# Patient Record
Sex: Female | Born: 1985 | Race: Black or African American | Hispanic: No | Marital: Single | Smoking: Never smoker
Health system: Southern US, Community
[De-identification: ages and names within clinical notes are randomized; demographics above are authoritative.]

---

## 2010-01-07 ENCOUNTER — Emergency Department (HOSPITAL_COMMUNITY): Admission: EM | Admit: 2010-01-07 | Discharge: 2010-01-07 | Payer: Self-pay | Admitting: Family Medicine

## 2010-01-07 ENCOUNTER — Inpatient Hospital Stay (HOSPITAL_COMMUNITY): Admission: AD | Admit: 2010-01-07 | Discharge: 2010-01-07 | Payer: Self-pay | Admitting: Obstetrics & Gynecology

## 2010-06-11 ENCOUNTER — Emergency Department (HOSPITAL_COMMUNITY): Admission: EM | Admit: 2010-06-11 | Discharge: 2010-06-11 | Payer: Self-pay | Admitting: Emergency Medicine

## 2010-07-08 ENCOUNTER — Emergency Department (HOSPITAL_COMMUNITY): Admission: EM | Admit: 2010-07-08 | Discharge: 2010-07-08 | Payer: Self-pay | Admitting: Family Medicine

## 2010-07-11 ENCOUNTER — Emergency Department (HOSPITAL_COMMUNITY): Admission: EM | Admit: 2010-07-11 | Discharge: 2010-07-11 | Payer: Self-pay | Admitting: Family Medicine

## 2010-07-23 ENCOUNTER — Emergency Department (HOSPITAL_COMMUNITY): Admission: EM | Admit: 2010-07-23 | Discharge: 2010-07-23 | Payer: Self-pay | Admitting: Family Medicine

## 2011-01-28 ENCOUNTER — Inpatient Hospital Stay (INDEPENDENT_AMBULATORY_CARE_PROVIDER_SITE_OTHER)
Admission: RE | Admit: 2011-01-28 | Discharge: 2011-01-28 | Disposition: A | Discharge status: Home / Self Care | Payer: Self-pay | Source: Ambulatory Visit | Attending: Family Medicine | Admitting: Family Medicine

## 2011-01-28 DIAGNOSIS — H9209 Otalgia, unspecified ear: Secondary | ICD-10-CM

## 2011-02-06 LAB — POCT URINALYSIS DIPSTICK
Nitrite: NEGATIVE
Nitrite: POSITIVE — AB
Protein, ur: 100 mg/dL — AB
Protein, ur: 100 mg/dL — AB
Specific Gravity, Urine: 1.025 (ref 1.005–1.030)
Specific Gravity, Urine: >=1.03 (ref 1.005–1.030)
Specific Gravity, Urine: >=1.03 (ref 1.005–1.030)
Urobilinogen, UA: 0.2 mg/dL (ref 0.0–1.0)
Urobilinogen, UA: 1 mg/dL (ref 0.0–1.0)
Urobilinogen, UA: 1 mg/dL (ref 0.0–1.0)

## 2011-02-06 LAB — URINE CULTURE
Colony Count: >=100000
Culture  Setup Time: 201109010050

## 2011-02-06 LAB — WET PREP, GENITAL: Trich, Wet Prep: NONE SEEN

## 2011-02-06 LAB — POCT PREGNANCY, URINE
Preg Test, Ur: POSITIVE
Preg Test, Ur: POSITIVE

## 2011-02-06 LAB — HCG, QUANTITATIVE, PREGNANCY
hCG, Beta Chain, Quant, S: 11307 m[IU]/mL — ABNORMAL HIGH (ref <5)
hCG, Beta Chain, Quant, S: 1236 m[IU]/mL — ABNORMAL HIGH (ref <5)
hCG, Beta Chain, Quant, S: 9072 m[IU]/mL — ABNORMAL HIGH (ref <5)

## 2011-02-06 LAB — GC/CHLAMYDIA PROBE AMP, GENITAL
Chlamydia, DNA Probe: NEGATIVE
Chlamydia, DNA Probe: NEGATIVE
GC Probe Amp, Genital: NEGATIVE

## 2011-02-07 LAB — COMPREHENSIVE METABOLIC PANEL
ALT: 11 U/L (ref 0–35)
Albumin: 4 g/dL (ref 3.5–5.2)
Alkaline Phosphatase: 42 U/L (ref 39–117)
BUN: 9 mg/dL (ref 6–23)
Chloride: 108 mEq/L (ref 96–112)
GFR calc Af Amer: >60 mL/min (ref >60)
GFR calc non Af Amer: >60 mL/min (ref >60)
Sodium: 136 mEq/L (ref 135–145)
Total Bilirubin: 1.1 mg/dL (ref 0.3–1.2)

## 2011-02-07 LAB — DIFFERENTIAL
Basophils Absolute: 0 10*3/uL (ref 0.0–0.1)
Basophils Relative: 0 % (ref 0–1)
Eosinophils Absolute: 0.1 10*3/uL (ref 0.0–0.7)
Neutro Abs: 5.1 10*3/uL (ref 1.7–7.7)
Neutrophils Relative %: 67 % (ref 43–77)

## 2011-02-07 LAB — CBC
HCT: 37.4 % (ref 36.0–46.0)
MCH: 34.1 pg — ABNORMAL HIGH (ref 26.0–34.0)
MCV: 97.7 fL (ref 78.0–100.0)
Platelets: 209 10*3/uL (ref 150–400)
RBC: 3.82 MIL/uL — ABNORMAL LOW (ref 3.87–5.11)
WBC: 7.6 10*3/uL (ref 4.0–10.5)

## 2011-02-07 LAB — URINALYSIS, ROUTINE W REFLEX MICROSCOPIC
Bilirubin Urine: NEGATIVE
Ketones, ur: NEGATIVE mg/dL
Nitrite: NEGATIVE
Protein, ur: NEGATIVE mg/dL
Urobilinogen, UA: 0.2 mg/dL (ref 0.0–1.0)

## 2011-02-07 LAB — URINE MICROSCOPIC-ADD ON

## 2011-02-07 LAB — LIPASE, BLOOD: Lipase: 27 U/L (ref 11–59)

## 2011-02-12 LAB — CBC
HCT: 43.2 % (ref 36.0–46.0)
MCV: 95.5 fL (ref 78.0–100.0)
Platelets: 263 10*3/uL (ref 150–400)
RBC: 4.52 MIL/uL (ref 3.87–5.11)
WBC: 11.7 10*3/uL — ABNORMAL HIGH (ref 4.0–10.5)

## 2011-02-12 LAB — WET PREP, GENITAL: Trich, Wet Prep: NONE SEEN

## 2011-02-12 LAB — URINALYSIS, ROUTINE W REFLEX MICROSCOPIC
Hgb urine dipstick: NEGATIVE
Protein, ur: NEGATIVE mg/dL
Specific Gravity, Urine: >1.03 — ABNORMAL HIGH (ref 1.005–1.030)
Urobilinogen, UA: 0.2 mg/dL (ref 0.0–1.0)

## 2011-02-12 LAB — POCT URINALYSIS DIP (DEVICE)
Bilirubin Urine: NEGATIVE
Glucose, UA: NEGATIVE mg/dL
Nitrite: NEGATIVE

## 2011-02-12 LAB — HCG, QUANTITATIVE, PREGNANCY: hCG, Beta Chain, Quant, S: 56258 m[IU]/mL — ABNORMAL HIGH (ref <5)

## 2011-02-12 LAB — POCT PREGNANCY, URINE: Preg Test, Ur: POSITIVE

## 2011-04-23 ENCOUNTER — Inpatient Hospital Stay (INDEPENDENT_AMBULATORY_CARE_PROVIDER_SITE_OTHER)
Admission: RE | Admit: 2011-04-23 | Discharge: 2011-04-23 | Disposition: A | Discharge status: Home / Self Care | Payer: Self-pay | Source: Ambulatory Visit | Attending: Emergency Medicine | Admitting: Emergency Medicine

## 2011-04-23 DIAGNOSIS — B354 Tinea corporis: Secondary | ICD-10-CM

## 2011-08-13 IMAGING — US US OB COMP LESS 14 WK
1 series · 14 of 24 positions shown · non-contrast
Comparison: None

CLINICAL DATA: First trimester pregnancy with left lower quadrant
abdominal pain.

OBSTETRIC <14 WK US AND TRANSVAGINAL OB US
TECHNIQUE: Both transabdominal and transvaginal ultrasound
examinations were performed for complete evaluation of the
gestation as well as the maternal uterus, adnexal regions, and
pelvic cul-de-sac.

[Series 1: us ob comp less 14 wks · 0.18mm/px · 14 of 24 slices shown]
[im 1/24]
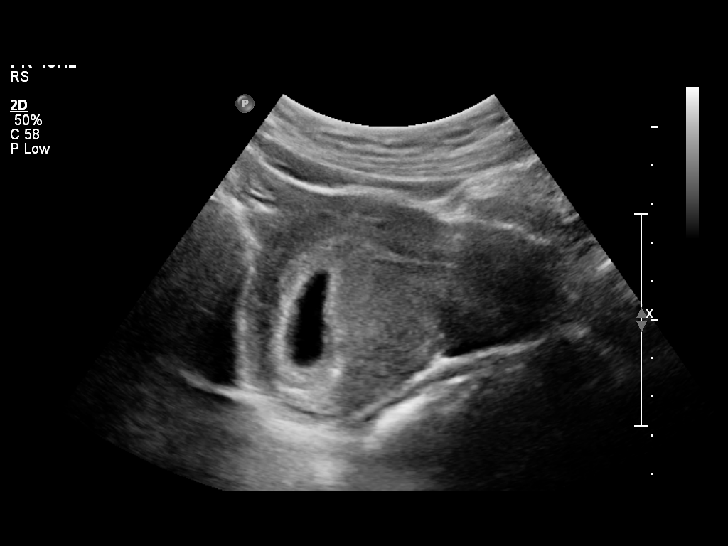
[im 3/24]
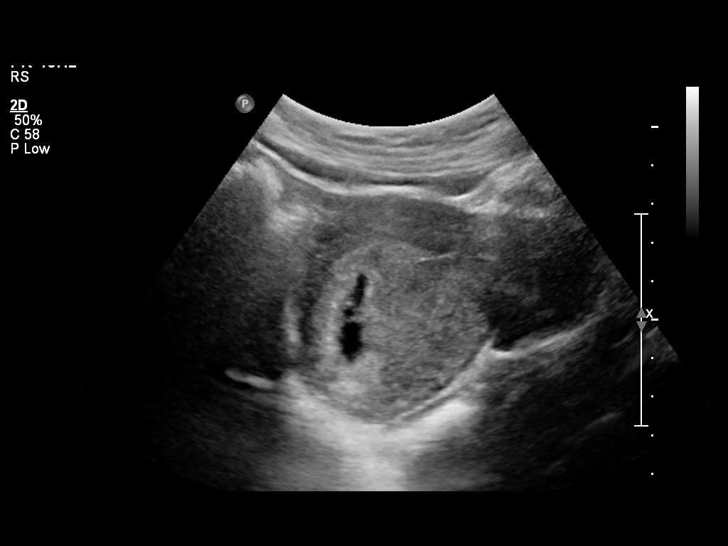
[im 5/24]
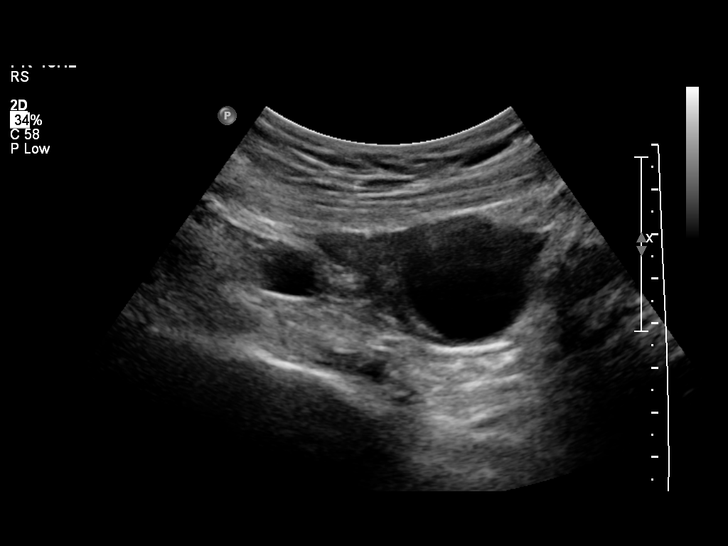
[im 7/24]
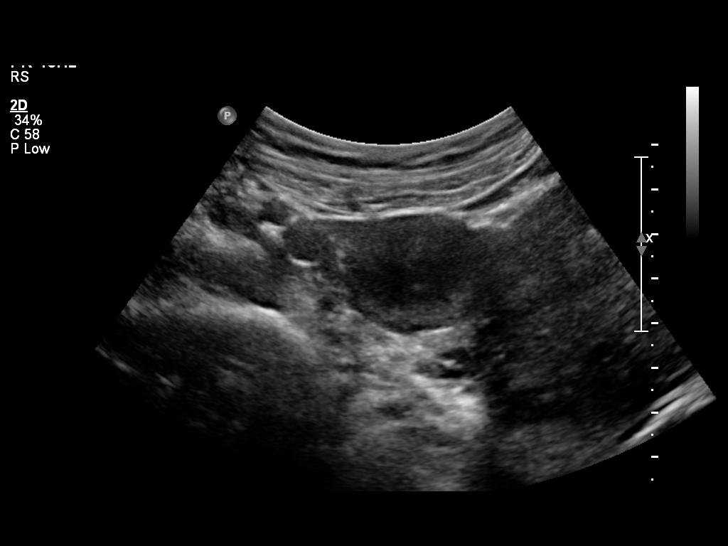
[im 8/24]
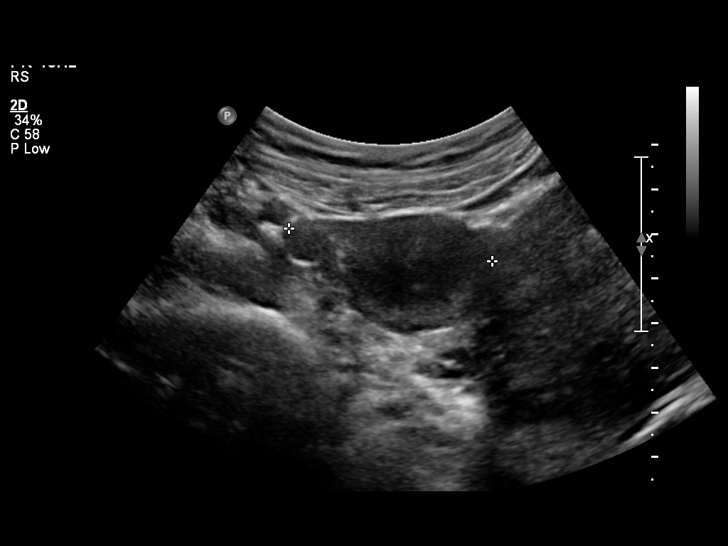
[im 10/24]
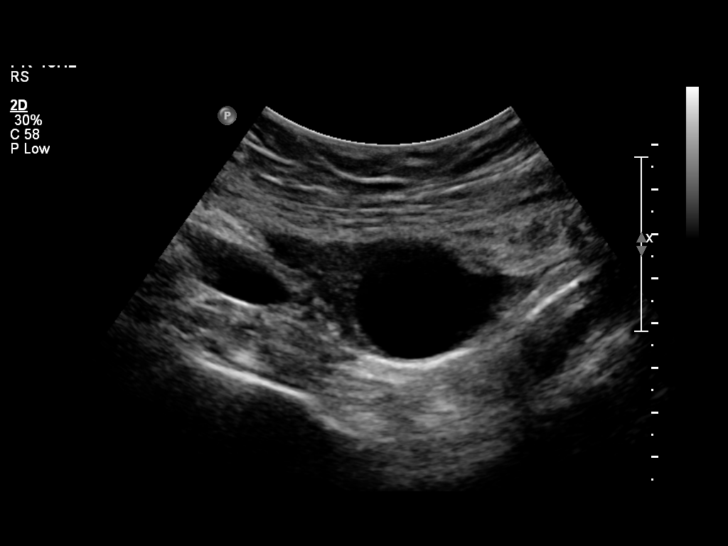
[im 12/24]
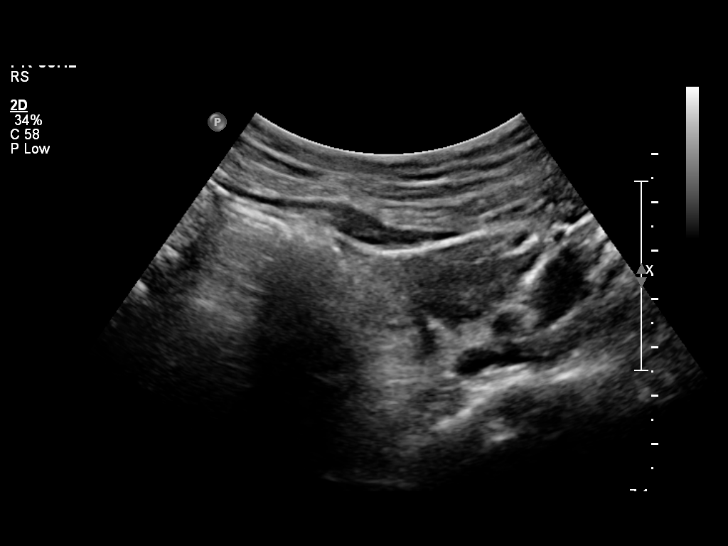
[im 13/24]
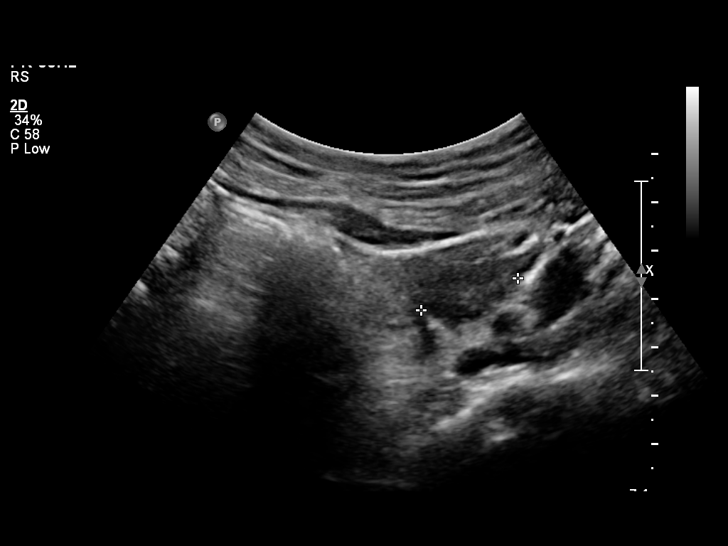
[im 15/24]
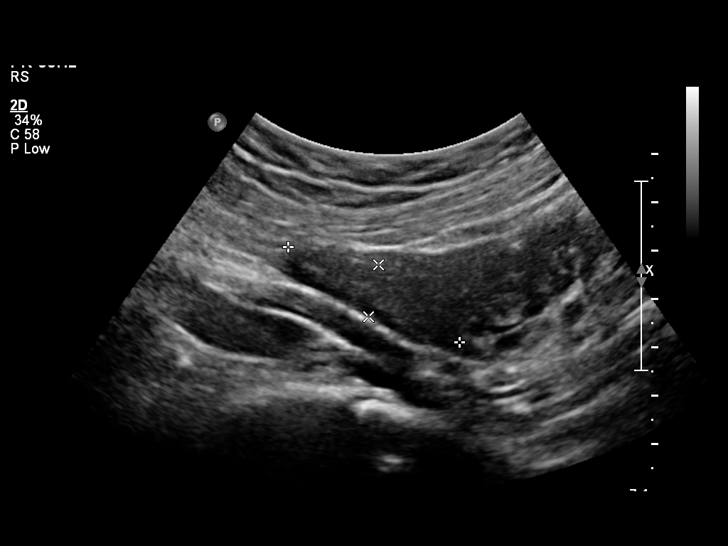
[im 17/24]
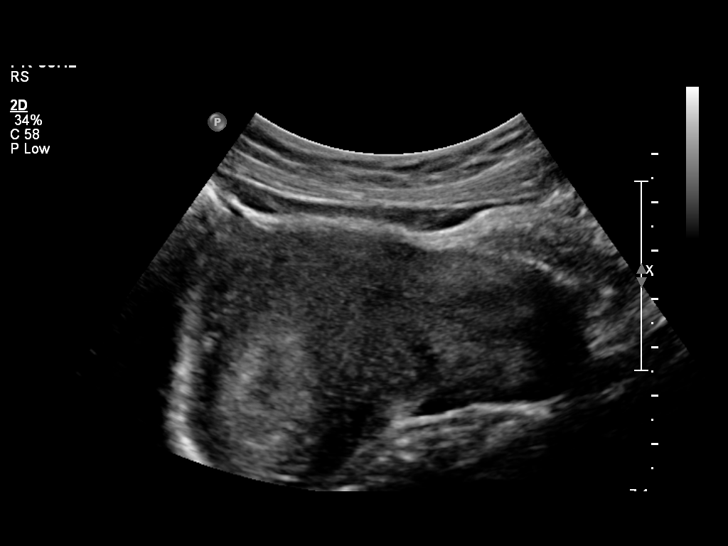
[im 19/24]
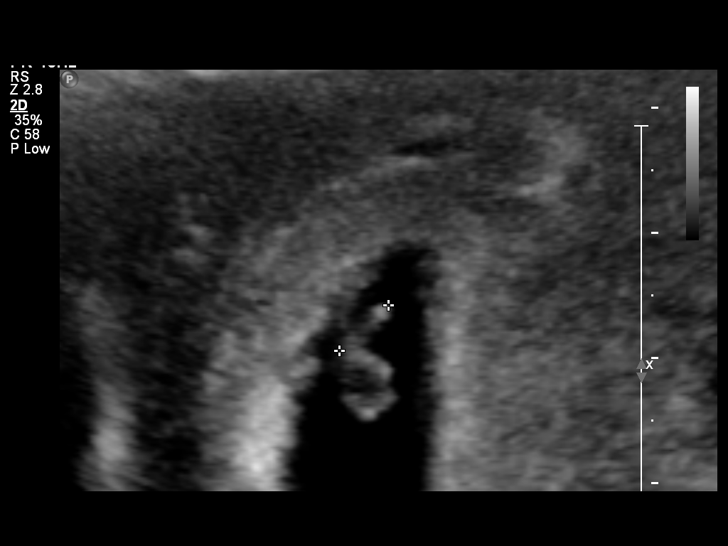
[im 20/24]
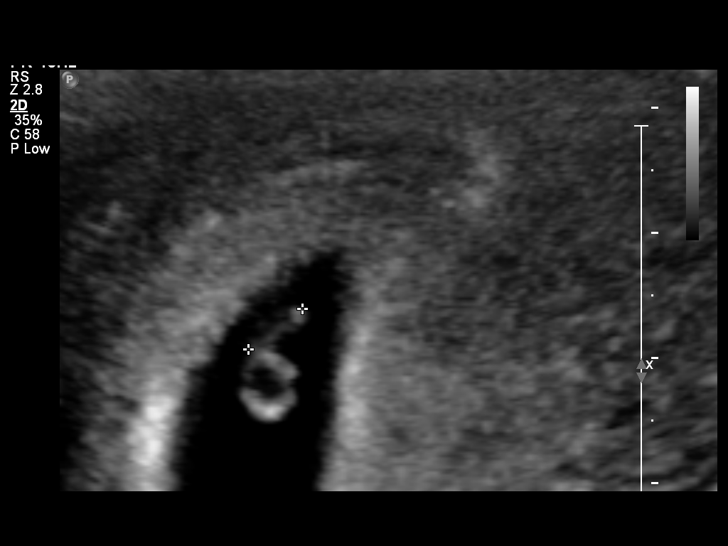
[im 22/24]
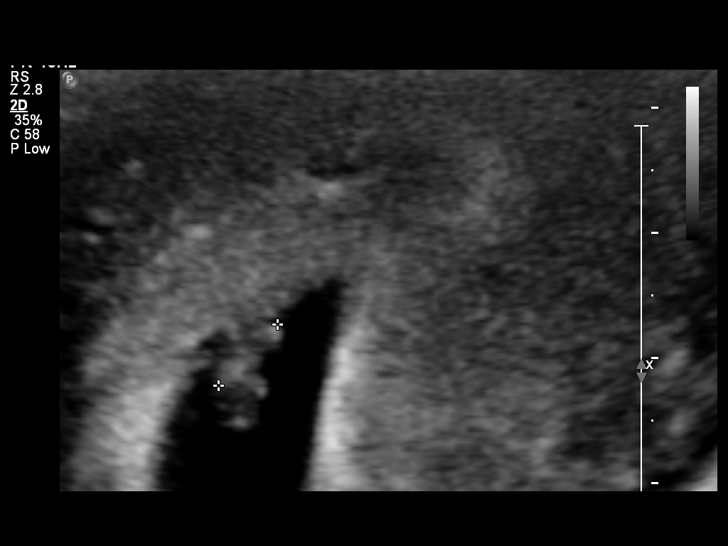
[im 24/24]
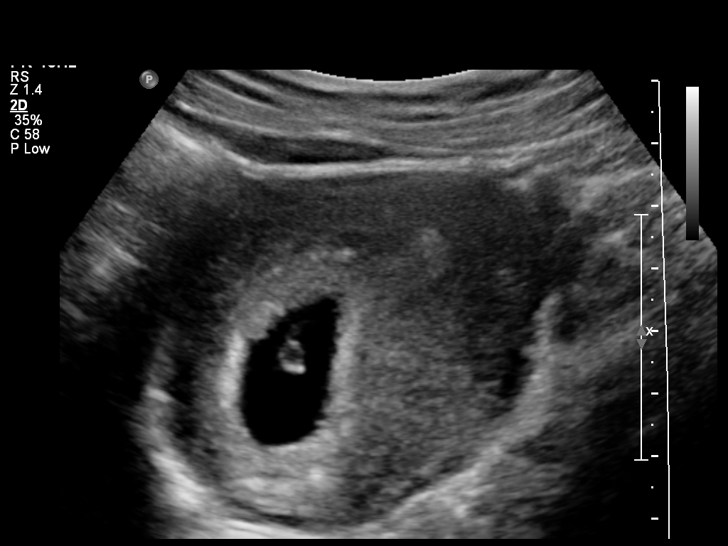

[14 of 24 positions shown; findings below may reference images not displayed]

FINDINGS: There is an intrauterine gestational sac with a fetal
pole demonstrating a crown-rump length of 5.8 mm, corresponding
with a gestational age of 6 weeks 3 days.  There is fetal cardiac
motion at 150 beats per minute.  A yolk sac is identified.  No
significant subchorionic hematoma is seen.

Both maternal ovaries are visualized with a small simple cyst on
the right.  There is no adnexal mass or significant free pelvic
fluid.
IMPRESSION: Single live intrauterine gestation with best estimated gestational
age of 6-week 3 days.  EDC is 08/30/2010.  No demonstrated
abnormality.

## 2012-07-18 ENCOUNTER — Emergency Department (HOSPITAL_COMMUNITY)
Admission: EM | Admit: 2012-07-18 | Discharge: 2012-07-18 | Disposition: A | Discharge status: Home / Self Care | Payer: Self-pay | Attending: Emergency Medicine | Admitting: Emergency Medicine

## 2012-07-18 ENCOUNTER — Encounter (HOSPITAL_COMMUNITY): Payer: Self-pay | Admitting: *Deleted

## 2012-07-18 DIAGNOSIS — N390 Urinary tract infection, site not specified: Secondary | ICD-10-CM | POA: Insufficient documentation

## 2012-07-18 LAB — URINALYSIS, ROUTINE W REFLEX MICROSCOPIC
Glucose, UA: NEGATIVE mg/dL
Ketones, ur: NEGATIVE mg/dL
Nitrite: NEGATIVE
Protein, ur: NEGATIVE mg/dL
Urobilinogen, UA: 0.2 mg/dL (ref 0.0–1.0)

## 2012-07-18 LAB — URINE MICROSCOPIC-ADD ON

## 2012-07-18 MED ORDER — SULFAMETHOXAZOLE-TRIMETHOPRIM 800-160 MG PO TABS: 1.0000 | ORAL_TABLET | Freq: Two times a day (BID) | ORAL | Status: AC

## 2012-07-18 NOTE — ED Provider Notes (Signed)
Attempted to see pt several times over 30 minutes, pt has not been in the room.  No evaluation was performed.  Tobin Chad, MD 07/18/12 [phone]

## 2012-07-18 NOTE — ED Notes (Signed)
Bed:WA03<BR> Expected date:<BR> Expected time:<BR> Means of arrival:<BR> Comments:<BR> Closed 

## 2012-07-18 NOTE — ED Notes (Signed)
Pt sts pain in her lower abdomen related to the urinary frequency.

## 2012-07-18 NOTE — ED Notes (Signed)
Pt sts she has had pain and frequency with urination x 4 days. Sts she is taking ibuprofen for pain.

## 2012-07-21 NOTE — ED Provider Notes (Signed)
History     CSN: [id number]  Arrival date & time 07/18/12  1228   First MD Initiated Contact with Patient 07/18/12 1417      Chief Complaint  Patient presents with  . Urinary Frequency    (Consider location/radiation/quality/duration/timing/severity/associated sxs/prior treatment) HPI Comments: Patient reports dysuria for the past 4 days. She reports associated suprapubic discomfort, increased urinary frequency and malodorous urine. She has taken ibuprofen for the discomfort without relief. She denies hematuria, fever, back pain. She has never had a UTI before.   Patient is a 25 y.o. female presenting with frequency.  Urinary Frequency Pertinent negatives include no chills, diaphoresis, fatigue, fever, headaches, nausea, rash, vomiting or weakness.    History reviewed. No pertinent past medical history.  History reviewed. No pertinent past surgical history.  History reviewed. No pertinent family history.  History  Substance Use Topics  . Smoking status: Never Smoker   . Smokeless tobacco: Not on file  . Alcohol Use: No    OB History    Grav Para Term Preterm Abortions TAB SAB Ect Mult Living                  Review of Systems  Constitutional: Negative for fever, chills, diaphoresis and fatigue.  Gastrointestinal: Negative for nausea, vomiting and diarrhea.  Genitourinary: Positive for dysuria and frequency.  Musculoskeletal: Negative for back pain.  Skin: Negative for rash and wound.  Neurological: Negative for dizziness, weakness and headaches.    Allergies  Review of patient's allergies indicates no known allergies.  Home Medications   Current Outpatient Rx  Name Route Sig Dispense Refill  . IBUPROFEN 200 MG PO TABS Oral Take 400 mg by mouth every 6 (six) hours as needed. Pain    . SULFAMETHOXAZOLE-TRIMETHOPRIM 800-160 MG PO TABS Oral Take 1 tablet by mouth 2 (two) times daily. 14 tablet 0    BP 114/79  Pulse 75  Temp 98.1 F (36.7 C) (Oral)  Resp 16   Ht 5\' 6"  (1.676 m)  Wt 134 lb (60.782 kg)  BMI 21.63 kg/m2  SpO2 100%  LMP 06/27/2012  Physical Exam  Nursing note and vitals reviewed. Constitutional: She is oriented to person, place, and time. She appears well-developed and well-nourished. No distress.  HENT:  Head: Normocephalic and atraumatic.  Eyes: Conjunctivae are normal. No scleral icterus.  Neck: Normal range of motion.  Cardiovascular: Normal rate and regular rhythm.  Exam reveals no gallop and no friction rub.   No murmur heard. Pulmonary/Chest: Effort normal and breath sounds normal. No respiratory distress. She has no wheezes. She has no rales. She exhibits no tenderness.  Abdominal: Soft.       Suprapubic tenderness to palpation.   Musculoskeletal: Normal range of motion.  Neurological: She is alert and oriented to person, place, and time.  Skin: Skin is warm and dry. She is not diaphoretic.  Psychiatric: She has a normal mood and affect. Her behavior is normal.    ED Course  Procedures (including critical care time)  Labs Reviewed  URINALYSIS, ROUTINE W REFLEX MICROSCOPIC - Abnormal; Notable for the following:    APPearance CLOUDY (*)     Hgb urine dipstick MODERATE (*)     Leukocytes, UA LARGE (*)     All other components within normal limits  URINE MICROSCOPIC-ADD ON  LAB REPORT - SCANNED   No results found.   1. UTI (lower urinary tract infection)       MDM  5:13 PM Patient has a  UTI as shown on her urinalysis. I will treat her with Bactrim. She should return with any new or worsening symptoms. No further evaluation needed at this time.         Emilia Beck, PA-C 07/21/12 1717

## 2012-07-22 NOTE — ED Provider Notes (Signed)
Medical screening examination/treatment/procedure(s) were performed by non-physician practitioner and as supervising physician I was immediately available for consultation/collaboration.  Toy Baker, MD 07/22/12 [phone]

## 2012-08-22 ENCOUNTER — Emergency Department (HOSPITAL_COMMUNITY)
Admission: EM | Admit: 2012-08-22 | Discharge: 2012-08-22 | Disposition: A | Discharge status: Home / Self Care | Payer: Self-pay | Attending: Emergency Medicine | Admitting: Emergency Medicine

## 2012-08-22 ENCOUNTER — Encounter (HOSPITAL_COMMUNITY): Payer: Self-pay | Admitting: Emergency Medicine

## 2012-08-22 DIAGNOSIS — N390 Urinary tract infection, site not specified: Secondary | ICD-10-CM | POA: Insufficient documentation

## 2012-08-22 DIAGNOSIS — R110 Nausea: Secondary | ICD-10-CM | POA: Insufficient documentation

## 2012-08-22 LAB — COMPREHENSIVE METABOLIC PANEL
ALT: 10 U/L (ref 0–35)
Alkaline Phosphatase: 53 U/L (ref 39–117)
BUN: 11 mg/dL (ref 6–23)
CO2: 24 mEq/L (ref 19–32)
Calcium: 9.7 mg/dL (ref 8.4–10.5)
GFR calc Af Amer: >90 mL/min (ref >90)
GFR calc non Af Amer: >90 mL/min (ref >90)
Glucose, Bld: 109 mg/dL — ABNORMAL HIGH (ref 70–99)
Potassium: 3.6 mEq/L (ref 3.5–5.1)
Sodium: 137 mEq/L (ref 135–145)

## 2012-08-22 LAB — CBC WITH DIFFERENTIAL/PLATELET
Eosinophils Relative: 0 % (ref 0–5)
HCT: 39.8 % (ref 36.0–46.0)
Hemoglobin: 14.5 g/dL (ref 12.0–15.0)
Lymphocytes Relative: 13 % (ref 12–46)
Lymphs Abs: 2 10*3/uL (ref 0.7–4.0)
MCH: 33.4 pg (ref 26.0–34.0)
MCV: 91.7 fL (ref 78.0–100.0)
Monocytes Relative: 5 % (ref 3–12)
Platelets: 274 10*3/uL (ref 150–400)
RBC: 4.34 MIL/uL (ref 3.87–5.11)
WBC: 15.6 10*3/uL — ABNORMAL HIGH (ref 4.0–10.5)

## 2012-08-22 LAB — URINALYSIS, ROUTINE W REFLEX MICROSCOPIC
Bilirubin Urine: NEGATIVE
Glucose, UA: NEGATIVE mg/dL
Ketones, ur: NEGATIVE mg/dL
Nitrite: NEGATIVE
Specific Gravity, Urine: 1.005 (ref 1.005–1.030)
pH: 6.5 (ref 5.0–8.0)

## 2012-08-22 LAB — URINE MICROSCOPIC-ADD ON

## 2012-08-22 MED ORDER — OXYCODONE-ACETAMINOPHEN 5-325 MG PO TABS: 1.0000 | ORAL_TABLET | Freq: Four times a day (QID) | ORAL | Status: DC | PRN

## 2012-08-22 MED ORDER — SODIUM CHLORIDE 0.9 % IV BOLUS (SEPSIS)
1000.0000 mL | Freq: Once | INTRAVENOUS | Status: AC
  Administered 2012-08-22: 1000 mL via INTRAVENOUS

## 2012-08-22 MED ORDER — CEFTRIAXONE SODIUM 1 G IJ SOLR
1.0000 g | Freq: Once | INTRAMUSCULAR | Status: AC
  Administered 2012-08-22: 1 g via INTRAVENOUS
  Filled 2012-08-22: qty 10

## 2012-08-22 MED ORDER — FENTANYL CITRATE 0.05 MG/ML IJ SOLN
50.0000 ug | Freq: Once | INTRAMUSCULAR | Status: AC
  Administered 2012-08-22: 50 ug via INTRAVENOUS
  Filled 2012-08-22: qty 2

## 2012-08-22 MED ORDER — ONDANSETRON HCL 4 MG/2ML IJ SOLN
4.0000 mg | Freq: Once | INTRAMUSCULAR | Status: AC
  Administered 2012-08-22: 4 mg via INTRAVENOUS
  Filled 2012-08-22: qty 2

## 2012-08-22 MED ORDER — CIPROFLOXACIN HCL 500 MG PO TABS: 500.0000 mg | ORAL_TABLET | Freq: Two times a day (BID) | ORAL | Status: DC

## 2012-08-22 NOTE — ED Provider Notes (Signed)
History     CSN: [id number]  Arrival date & time 08/22/12  0014   First MD Initiated Contact with Patient 08/22/12 0256      Chief Complaint  Patient presents with  . Dysuria  . Nausea    (Consider location/radiation/quality/duration/timing/severity/associated sxs/prior treatment) Patient is a 26 y.o. female presenting with dysuria. The history is provided by the patient.  Dysuria  Associated symptoms include nausea, vomiting and frequency. Pertinent negatives include no flank pain.   patient has had dysuria today. Feeling fine yesterday. Urinary frequency. Also some nauseousness and vomiting. No diarrhea. No clear fevers. She had recently been treated for urinary tract infection. She denies possibility of pregnancy. She has moderate lower abdominal pain also.  History reviewed. No pertinent past medical history.  History reviewed. No pertinent past surgical history.  No family history on file.  History  Substance Use Topics  . Smoking status: Never Smoker   . Smokeless tobacco: Never Used  . Alcohol Use: No    OB History    Grav Para Term Preterm Abortions TAB SAB Ect Mult Living                  Review of Systems  Constitutional: Negative for activity change and appetite change.  HENT: Negative for neck stiffness.   Eyes: Negative for pain.  Respiratory: Negative for chest tightness and shortness of breath.   Cardiovascular: Negative for chest pain and leg swelling.  Gastrointestinal: Positive for nausea, vomiting and abdominal pain. Negative for diarrhea.  Genitourinary: Positive for dysuria and frequency. Negative for flank pain, vaginal bleeding, vaginal discharge and vaginal pain.  Musculoskeletal: Negative for back pain.  Skin: Negative for rash.  Neurological: Negative for weakness, numbness and headaches.  Psychiatric/Behavioral: Negative for behavioral problems.    Allergies  Review of patient's allergies indicates no known allergies.  Home  Medications  No current outpatient prescriptions on file.  BP 98/61  Pulse 68  Temp 97.5 F (36.4 C) (Oral)  Resp 16  Ht 5\' 6"  (1.676 m)  Wt 136 lb (61.689 kg)  BMI 21.95 kg/m2  SpO2 100%  LMP 08/21/2012  Physical Exam  Nursing note and vitals reviewed. Constitutional: She is oriented to person, place, and time. She appears well-developed and well-nourished.  HENT:  Head: Normocephalic and atraumatic.  Eyes: EOM are normal. Pupils are equal, round, and reactive to light.  Neck: Normal range of motion. Neck supple.  Cardiovascular: Normal rate, regular rhythm and normal heart sounds.   No murmur heard. Pulmonary/Chest: Effort normal and breath sounds normal. No respiratory distress. She has no wheezes. She has no rales.  Abdominal: Soft. Bowel sounds are normal. She exhibits no distension. There is tenderness. There is no rebound and no guarding.  Genitourinary:       No CVA tenderness.  Musculoskeletal: Normal range of motion.  Neurological: She is alert and oriented to person, place, and time. No cranial nerve deficit.  Skin: Skin is warm and dry.  Psychiatric: She has a normal mood and affect. Her speech is normal.    ED Course  Procedures (including critical care time)  Labs Reviewed  URINALYSIS, ROUTINE W REFLEX MICROSCOPIC - Abnormal; Notable for the following:    APPearance CLOUDY (*)     Hgb urine dipstick LARGE (*)     Protein, ur 100 (*)     Leukocytes, UA LARGE (*)     All other components within normal limits  CBC WITH DIFFERENTIAL - Abnormal; Notable for the  following:    WBC 15.6 (*)     MCHC 36.4 (*)     Neutrophils Relative 81 (*)     Neutro Abs 12.7 (*)     All other components within normal limits  COMPREHENSIVE METABOLIC PANEL - Abnormal; Notable for the following:    Glucose, Bld 109 (*)     All other components within normal limits  URINE MICROSCOPIC-ADD ON - Abnormal; Notable for the following:    Bacteria, UA MANY (*)     All other  components within normal limits  PREGNANCY, URINE  URINE CULTURE   No results found.   1. UTI (urinary tract infection)       MDM  Patient with lower abdominal pain. UTI. White count is elevated at 15. She's been recently chewed for UTI. She'll be discharged home urine culture was sent. Patient was also given IV Rocephin since she's recently been on antibiotics.        Juliet Rude. Rubin Payor, MD 08/22/12 0600

## 2012-08-22 NOTE — ED Notes (Signed)
Pt reports 10/10 pain in groin. Pt reports painful and frequent urination. Pt denies discharge.

## 2012-08-25 LAB — URINE CULTURE: Colony Count: >=100000

## 2012-08-26 NOTE — ED Notes (Signed)
+   urine  Patient treated appropriately -sensitive to same-chart appended per protocol MD.  

## 2013-08-18 ENCOUNTER — Encounter (HOSPITAL_COMMUNITY): Payer: Self-pay | Admitting: Emergency Medicine

## 2013-08-18 ENCOUNTER — Emergency Department (HOSPITAL_COMMUNITY)
Admission: EM | Admit: 2013-08-18 | Discharge: 2013-08-18 | Disposition: A | Discharge status: Home / Self Care | Payer: BC Managed Care – PPO | Attending: Emergency Medicine | Admitting: Emergency Medicine

## 2013-08-18 DIAGNOSIS — Z3202 Encounter for pregnancy test, result negative: Secondary | ICD-10-CM | POA: Insufficient documentation

## 2013-08-18 DIAGNOSIS — M549 Dorsalgia, unspecified: Secondary | ICD-10-CM | POA: Insufficient documentation

## 2013-08-18 DIAGNOSIS — R112 Nausea with vomiting, unspecified: Secondary | ICD-10-CM

## 2013-08-18 DIAGNOSIS — R059 Cough, unspecified: Secondary | ICD-10-CM | POA: Insufficient documentation

## 2013-08-18 DIAGNOSIS — R6883 Chills (without fever): Secondary | ICD-10-CM | POA: Insufficient documentation

## 2013-08-18 DIAGNOSIS — N390 Urinary tract infection, site not specified: Secondary | ICD-10-CM | POA: Insufficient documentation

## 2013-08-18 DIAGNOSIS — R05 Cough: Secondary | ICD-10-CM | POA: Insufficient documentation

## 2013-08-18 DIAGNOSIS — J3489 Other specified disorders of nose and nasal sinuses: Secondary | ICD-10-CM | POA: Insufficient documentation

## 2013-08-18 LAB — URINALYSIS, ROUTINE W REFLEX MICROSCOPIC
Ketones, ur: 40 mg/dL — AB
Nitrite: NEGATIVE
Protein, ur: NEGATIVE mg/dL
Urobilinogen, UA: 1 mg/dL (ref 0.0–1.0)

## 2013-08-18 LAB — URINE MICROSCOPIC-ADD ON

## 2013-08-18 LAB — POCT I-STAT, CHEM 8
BUN: 14 mg/dL (ref 6–23)
Calcium, Ion: 1.11 mmol/L — ABNORMAL LOW (ref 1.12–1.23)
HCT: 38 % (ref 36.0–46.0)
Sodium: 142 mEq/L (ref 135–145)
TCO2: 20 mmol/L (ref 0–100)

## 2013-08-18 MED ORDER — ACETAMINOPHEN 325 MG PO TABS
650.0000 mg | ORAL_TABLET | Freq: Once | ORAL | Status: AC
  Administered 2013-08-18: 650 mg via ORAL
  Filled 2013-08-18: qty 2

## 2013-08-18 MED ORDER — SODIUM CHLORIDE 0.9 % IV BOLUS (SEPSIS)
1000.0000 mL | Freq: Once | INTRAVENOUS | Status: AC
  Administered 2013-08-18: 1000 mL via INTRAVENOUS

## 2013-08-18 MED ORDER — ONDANSETRON 8 MG PO TBDP: 8.0000 mg | ORAL_TABLET | Freq: Three times a day (TID) | ORAL | Status: DC | PRN

## 2013-08-18 MED ORDER — ONDANSETRON HCL 4 MG/2ML IJ SOLN
4.0000 mg | Freq: Once | INTRAMUSCULAR | Status: AC
  Administered 2013-08-18: 4 mg via INTRAVENOUS
  Filled 2013-08-18: qty 2

## 2013-08-18 MED ORDER — CEPHALEXIN 500 MG PO CAPS: 500.0000 mg | ORAL_CAPSULE | Freq: Four times a day (QID) | ORAL | Status: DC

## 2013-08-18 NOTE — ED Notes (Signed)
Pt presents tonight with c/o headache  States has had her headache for the past day and a half  Pt states she has had a cough and has been vomiting as well

## 2013-08-18 NOTE — ED Notes (Signed)
Attempted lab draw x 2 but unsuccessful. RN,Shannon attempted draw back from IV site but unsuccessful.

## 2013-08-18 NOTE — ED Provider Notes (Signed)
CSN: [id number]     Arrival date & time 08/18/13  0272 History  First MD Initiated Contact with Patient 08/18/13 [phone]     Chief Complaint  Patient presents with  . Headache    HPI Patient presents to emergency room with complaints of nausea, vomiting, headache, cough for the last day and a half. Patient states she's had nasal congestion and started developing a headache. She has also had episodes of nausea vomiting and has been having trouble keeping down any foods or fluids. She denies diarrhea or abdominal pain. She has felt chilled but has not measured a fever at home. She denies any neck pain or neck stiffness. She denies any rashes. History reviewed. No pertinent past medical history. History reviewed. No pertinent past surgical history. History reviewed. No pertinent family history. History  Substance Use Topics  . Smoking status: Never Smoker   . Smokeless tobacco: Never Used  . Alcohol Use: No   OB History   Grav Para Term Preterm Abortions TAB SAB Ect Mult Living                 Review of Systems  Constitutional: Negative for fever and unexpected weight change.  HENT: Negative for neck pain.   Respiratory: Negative for choking, chest tightness and shortness of breath.   Gastrointestinal: Positive for vomiting. Negative for nausea and diarrhea.  Genitourinary: Negative for dysuria and hematuria.  Musculoskeletal: Positive for back pain.  Skin: Negative for rash.  Neurological: Positive for headaches. Negative for weakness.  Psychiatric/Behavioral: Negative for confusion.  All other systems reviewed and are negative.    Allergies  Review of patient's allergies indicates no known allergies.  Home Medications   Current Outpatient Rx  Name  Route  Sig  Dispense  Refill  . cephALEXin (KEFLEX) 500 MG capsule   Oral   Take 1 capsule (500 mg total) by mouth 4 (four) times daily.   28 capsule   0   . ondansetron (ZOFRAN ODT) 8 MG disintegrating tablet   Oral   Take  1 tablet (8 mg total) by mouth every 8 (eight) hours as needed for nausea.   20 tablet   0    BP 100/65  Pulse 78  Temp(Src) 99.2 F (37.3 C) (Oral)  Resp 16  Ht 5\' 6"  (1.676 m)  Wt 134 lb (60.782 kg)  BMI 21.64 kg/m2  SpO2 99%  LMP 08/08/2013 Physical Exam  Nursing note and vitals reviewed. Constitutional: She appears well-developed and well-nourished. No distress.  HENT:  Head: Normocephalic and atraumatic.  Right Ear: External ear normal.  Left Ear: External ear normal.  Mouth/Throat: No oropharyngeal exudate.  Eyes: Conjunctivae are normal. Right eye exhibits no discharge. Left eye exhibits no discharge. No scleral icterus.  Neck: Neck supple. No tracheal deviation present.  Cardiovascular: Normal rate, regular rhythm and intact distal pulses.   Pulmonary/Chest: Effort normal and breath sounds normal. No stridor. No respiratory distress. She has no wheezes. She has no rales.  Abdominal: Soft. Bowel sounds are normal. She exhibits no distension. There is no tenderness. There is no rebound and no guarding.  Musculoskeletal: She exhibits no edema and no tenderness.  Neurological: She is alert. She has normal strength. No sensory deficit. Cranial nerve deficit:  no gross defecits noted. She exhibits normal muscle tone. She displays no seizure activity. Coordination normal.  Skin: Skin is warm and dry. No rash noted.  Psychiatric: She has a normal mood and affect.    ED Course  Procedures (including critical care time) Labs Review Labs Reviewed  URINALYSIS, ROUTINE W REFLEX MICROSCOPIC - Abnormal; Notable for the following:    Color, Urine AMBER (*)    APPearance CLOUDY (*)    Specific Gravity, Urine 1.032 (*)    Bilirubin Urine SMALL (*)    Ketones, ur 40 (*)    Leukocytes, UA TRACE (*)    All other components within normal limits  URINE MICROSCOPIC-ADD ON - Abnormal; Notable for the following:    Squamous Epithelial / LPF FEW (*)    Bacteria, UA MANY (*)    All other  components within normal limits  POCT I-STAT, CHEM 8 - Abnormal; Notable for the following:    Calcium, Ion 1.11 (*)    All other components within normal limits  URINE CULTURE  PREGNANCY, URINE   Imaging Review No results found.  MDM   1. UTI (urinary tract infection)   2. Nausea and vomiting    UA  Suggests uti.  No fever but possible early upper urinary tract infection.  Will dc home on abx and antinausea medications.  Pt improved in the ED with IV fluids and antiemetics.    Celene Kras, MD 08/18/13 [phone]

## 2013-08-19 LAB — URINE CULTURE: Culture: NO GROWTH

## 2016-07-30 ENCOUNTER — Emergency Department (HOSPITAL_COMMUNITY)
Admission: EM | Admit: 2016-07-30 | Discharge: 2016-07-31 | Disposition: A | Discharge status: Home / Self Care | Payer: Self-pay | Attending: Emergency Medicine | Admitting: Emergency Medicine

## 2016-07-30 ENCOUNTER — Emergency Department (HOSPITAL_COMMUNITY): Payer: Self-pay

## 2016-07-30 ENCOUNTER — Encounter (HOSPITAL_COMMUNITY): Payer: Self-pay | Admitting: Emergency Medicine

## 2016-07-30 DIAGNOSIS — B9689 Other specified bacterial agents as the cause of diseases classified elsewhere: Secondary | ICD-10-CM | POA: Insufficient documentation

## 2016-07-30 DIAGNOSIS — R52 Pain, unspecified: Secondary | ICD-10-CM

## 2016-07-30 DIAGNOSIS — N760 Acute vaginitis: Secondary | ICD-10-CM | POA: Insufficient documentation

## 2016-07-30 DIAGNOSIS — N946 Dysmenorrhea, unspecified: Secondary | ICD-10-CM | POA: Insufficient documentation

## 2016-07-30 DIAGNOSIS — R102 Pelvic and perineal pain: Secondary | ICD-10-CM | POA: Insufficient documentation

## 2016-07-30 LAB — URINALYSIS, ROUTINE W REFLEX MICROSCOPIC
BILIRUBIN URINE: NEGATIVE
Glucose, UA: NEGATIVE mg/dL
Ketones, ur: NEGATIVE mg/dL
Leukocytes, UA: NEGATIVE
Nitrite: NEGATIVE
PROTEIN: NEGATIVE mg/dL
Specific Gravity, Urine: 1.021 (ref 1.005–1.030)
pH: 6.5 (ref 5.0–8.0)

## 2016-07-30 LAB — LIPASE, BLOOD: Lipase: 29 U/L (ref 11–51)

## 2016-07-30 LAB — I-STAT BETA HCG BLOOD, ED (MC, WL, AP ONLY)

## 2016-07-30 LAB — COMPREHENSIVE METABOLIC PANEL
ALT: 17 U/L (ref 14–54)
AST: 21 U/L (ref 15–41)
Albumin: 4.2 g/dL (ref 3.5–5.0)
Alkaline Phosphatase: 52 U/L (ref 38–126)
Anion gap: 8 (ref 5–15)
BUN: 11 mg/dL (ref 6–20)
CO2: 26 mmol/L (ref 22–32)
CREATININE: 0.64 mg/dL (ref 0.44–1.00)
Calcium: 9.8 mg/dL (ref 8.9–10.3)
Chloride: 106 mmol/L (ref 101–111)
GFR calc non Af Amer: >60 mL/min (ref >60)
GLUCOSE: 93 mg/dL (ref 65–99)
Potassium: 3.7 mmol/L (ref 3.5–5.1)
Sodium: 140 mmol/L (ref 135–145)
Total Bilirubin: 2.1 mg/dL — ABNORMAL HIGH (ref 0.3–1.2)
Total Protein: 7.3 g/dL (ref 6.5–8.1)

## 2016-07-30 LAB — WET PREP, GENITAL
SPERM: NONE SEEN
TRICH WET PREP: NONE SEEN
YEAST WET PREP: NONE SEEN

## 2016-07-30 LAB — CBC
HCT: 40.8 % (ref 36.0–46.0)
Hemoglobin: 14.5 g/dL (ref 12.0–15.0)
MCH: 33.1 pg (ref 26.0–34.0)
MCHC: 35.5 g/dL (ref 30.0–36.0)
MCV: 93.2 fL (ref 78.0–100.0)
PLATELETS: 244 10*3/uL (ref 150–400)
RBC: 4.38 MIL/uL (ref 3.87–5.11)
RDW: 11.4 % — ABNORMAL LOW (ref 11.5–15.5)
WBC: 8.2 10*3/uL (ref 4.0–10.5)

## 2016-07-30 LAB — URINE MICROSCOPIC-ADD ON

## 2016-07-30 NOTE — ED Triage Notes (Signed)
Pt here with abdominal pain, nausea and headache x 1 week. Pt sts she does not know if she has had a fever. Pt denies diarrhea, urinary or vaginal symptoms.

## 2016-07-30 NOTE — ED Provider Notes (Signed)
[facility]-EMERGENCY DEPT Provider Note   CSN: [id number] Arrival date & time: 07/30/16  1241     History   Chief Complaint Chief Complaint  Patient presents with  . Abdominal Pain  . Headache    HPI Felicia Hopkins is a 30 y.o. female.  The history is provided by the patient. No language interpreter was used.  Abdominal Pain   This is a new problem. The problem occurs constantly. The problem has been gradually worsening. The pain is associated with an unknown factor. The pain is located in the suprapubic region. The pain is moderate. Associated symptoms include headaches. Nothing aggravates the symptoms. Nothing relieves the symptoms.  Headache    Pt reports lower abdominal pain for 1 week.  Pt reports vaginal bleeding. Pt reports she had a period August 12. No std risk. No vaginal discharge.  History reviewed. No pertinent past medical history.  There are no active problems to display for this patient.   History reviewed. No pertinent surgical history.  OB History    No data available       Home Medications    Prior to Admission medications   Medication Sig Start Date End Date Taking? Authorizing Provider  cephALEXin (KEFLEX) 500 MG capsule Take 1 capsule (500 mg total) by mouth 4 (four) times daily. 08/18/13   Linwood DibblesJon Knapp, MD  ondansetron (ZOFRAN ODT) 8 MG disintegrating tablet Take 1 tablet (8 mg total) by mouth every 8 (eight) hours as needed for nausea. 08/18/13   Linwood DibblesJon Knapp, MD    Family History History reviewed. No pertinent family history.  Social History Social History  Substance Use Topics  . Smoking status: Never Smoker  . Smokeless tobacco: Never Used  . Alcohol use No     Allergies   Review of patient's allergies indicates no known allergies.   Review of Systems Review of Systems  Gastrointestinal: Positive for abdominal pain.  Neurological: Positive for headaches.  All other systems reviewed and are negative.    Physical Exam Updated Vital  Signs BP 107/67   Pulse 62   Temp 98.1 F (36.7 C)   Resp 18   LMP 07/04/2016   SpO2 100%   Physical Exam  Constitutional: She appears well-developed and well-nourished. No distress.  HENT:  Head: Normocephalic and atraumatic.  Eyes: Conjunctivae are normal.  Neck: Neck supple.  Cardiovascular: Normal rate and regular rhythm.   No murmur heard. Pulmonary/Chest: Effort normal and breath sounds normal. No respiratory distress.  Abdominal: Soft. There is no tenderness.  Genitourinary: No vaginal discharge found.  Genitourinary Comments: Mild vaginal bleeding, dark,  Cervix nontender,  tender bilat adnexa.   Musculoskeletal: She exhibits no edema.  Neurological: She is alert.  Skin: Skin is warm and dry.  Psychiatric: She has a normal mood and affect.  Nursing note and vitals reviewed.    ED Treatments / Results  Labs (all labs ordered are listed, but only abnormal results are displayed) Labs Reviewed  COMPREHENSIVE METABOLIC PANEL - Abnormal; Notable for the following:       Result Value   Total Bilirubin 2.1 (*)    All other components within normal limits  CBC - Abnormal; Notable for the following:    RDW 11.4 (*)    All other components within normal limits  URINALYSIS, ROUTINE W REFLEX MICROSCOPIC (NOT [facility]) - Abnormal; Notable for the following:    APPearance CLOUDY (*)    Hgb urine dipstick LARGE (*)    All other components within  normal limits  URINE MICROSCOPIC-ADD ON - Abnormal; Notable for the following:    Squamous Epithelial / LPF 0-5 (*)    Bacteria, UA FEW (*)    All other components within normal limits  LIPASE, BLOOD  I-STAT BETA HCG BLOOD, ED ([facility], [facility], [facility] ONLY)    EKG  EKG Interpretation None       Radiology No results found.  Procedures Procedures (including critical care time)  Medications Ordered in ED Medications - No data to display   Initial Impression / Assessment and Plan / ED Course  I have reviewed the triage vital signs  and the nursing notes.  Pertinent labs & imaging results that were available during my care of the patient were reviewed by me and considered in my medical decision making (see chart for details).  Clinical Course    Pt has clue cells on exam.  I will treat for Bv.  Menstrual cycle within normal range.  I will treat with Flagyl and have pt follow up at [facility] for rechec.  Final Clinical Impressions(s) / ED Diagnoses   Final diagnoses:  Pain  BV (bacterial vaginosis)  Dysmenorrhea    New Prescriptions New Prescriptions   No medications on file     Elson Areas, PA-C 07/31/16 0049    Shaune Pollack, MD 07/31/16 1257

## 2016-07-31 LAB — GC/CHLAMYDIA PROBE AMP (~~LOC~~) NOT AT ARMC
CHLAMYDIA, DNA PROBE: NEGATIVE
Neisseria Gonorrhea: NEGATIVE

## 2016-07-31 MED ORDER — METRONIDAZOLE 500 MG PO TABS: 500.0000 mg | ORAL_TABLET | Freq: Two times a day (BID) | ORAL | 0 refills | Status: DC

## 2016-07-31 NOTE — ED Notes (Signed)
Pt verbalized understanding of d/c instructions and has no further questions. Pt stable and NAD. Pt declined wheelchair and ambulated to lobby. VSS

## 2017-11-11 IMAGING — US US TRANSVAGINAL NON-OB
1 series · 14 of 25 positions shown · non-contrast
Comparison: Pelvic ultrasound performed 01/07/2010

CLINICAL DATA: Acute onset of generalized pelvic pain. Initial
encounter.

EXAM:
TRANSABDOMINAL AND TRANSVAGINAL ULTRASOUND OF PELVIS
TECHNIQUE: Both transabdominal and transvaginal ultrasound examinations of the
pelvis were performed. Transabdominal technique was performed for
global imaging of the pelvis including uterus, ovaries, adnexal
regions, and pelvic cul-de-sac. It was necessary to proceed with
endovaginal exam following the transabdominal exam to visualize the
uterus and ovaries in greater detail.

[Series 1: us transvaginal non-ob · 0.24mm/px · 14 of 67 slices shown]
[im 1/67]
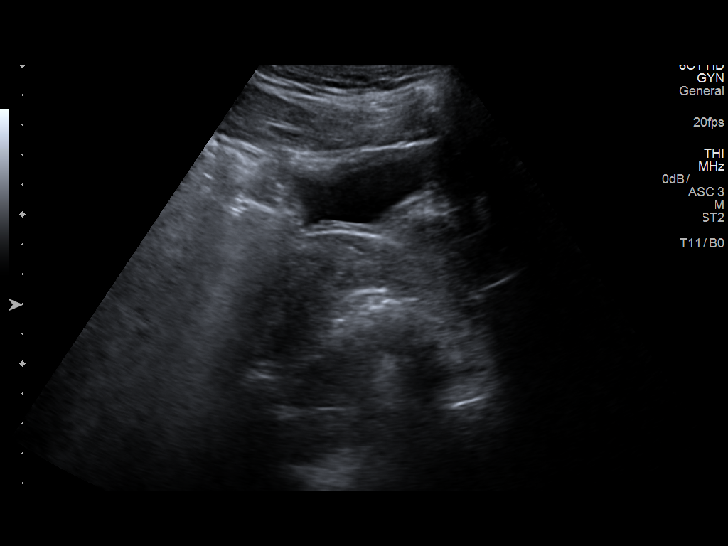
[im 6/67]
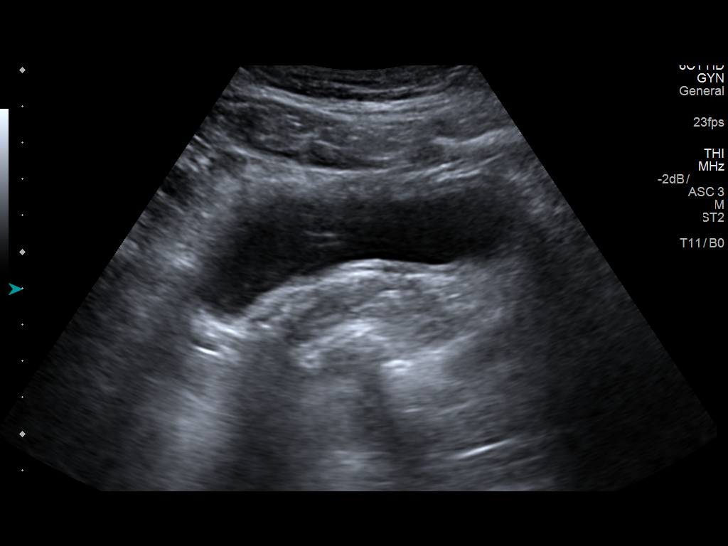
[im 12/67]
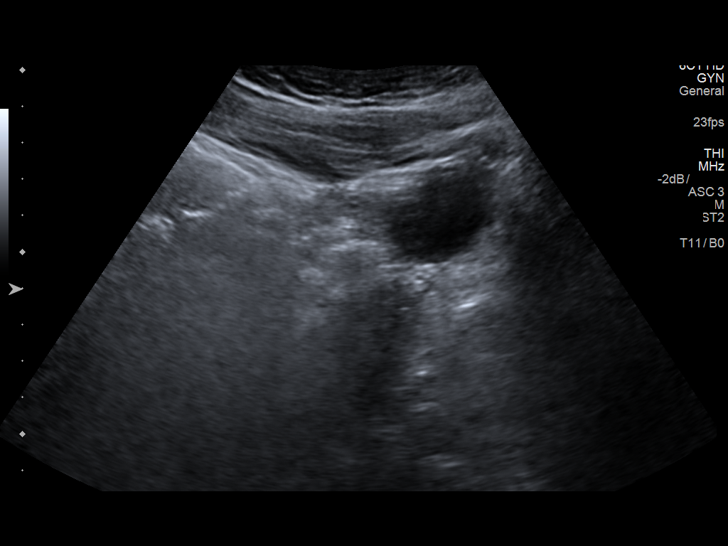
[im 17/67]
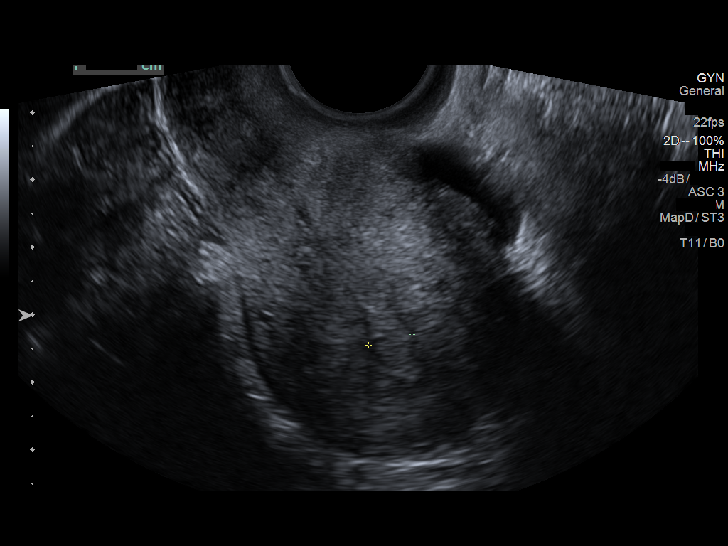
[im 23/67]
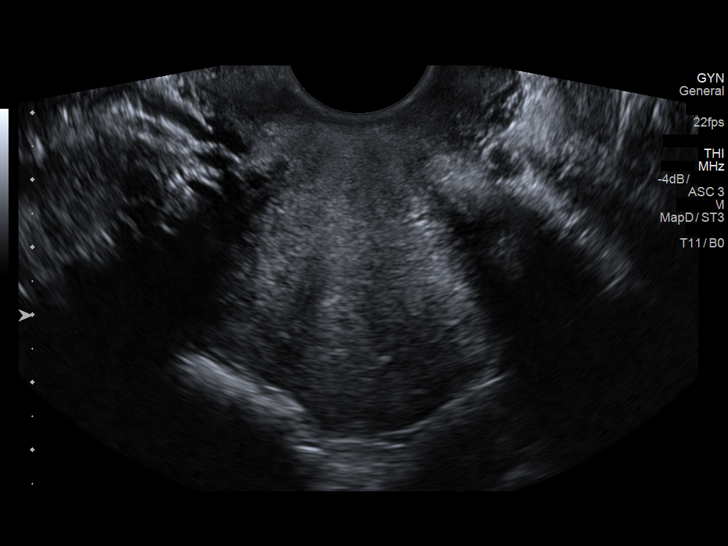
[im 25/67]
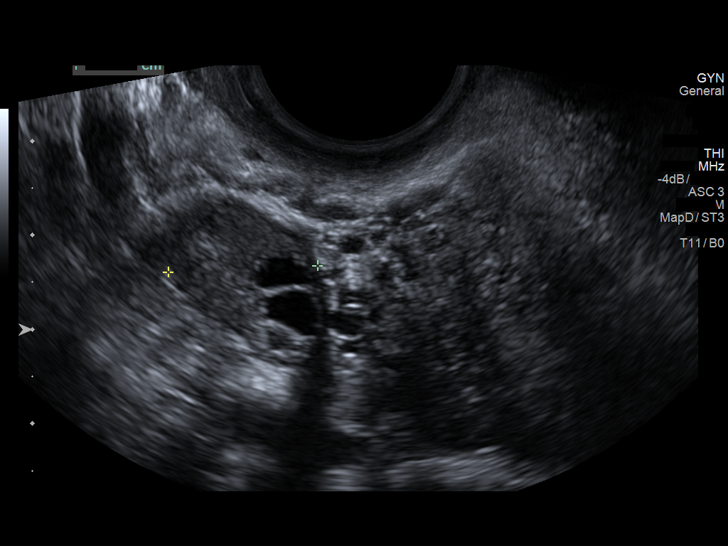
[im 31/67]
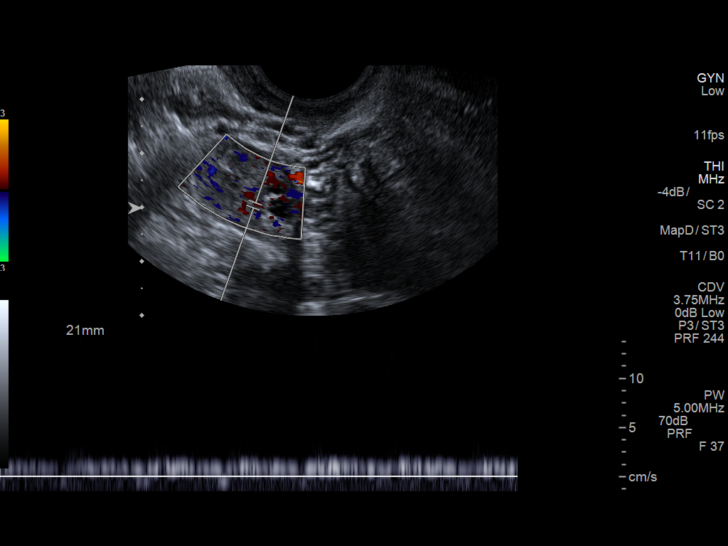
[im 36/67]
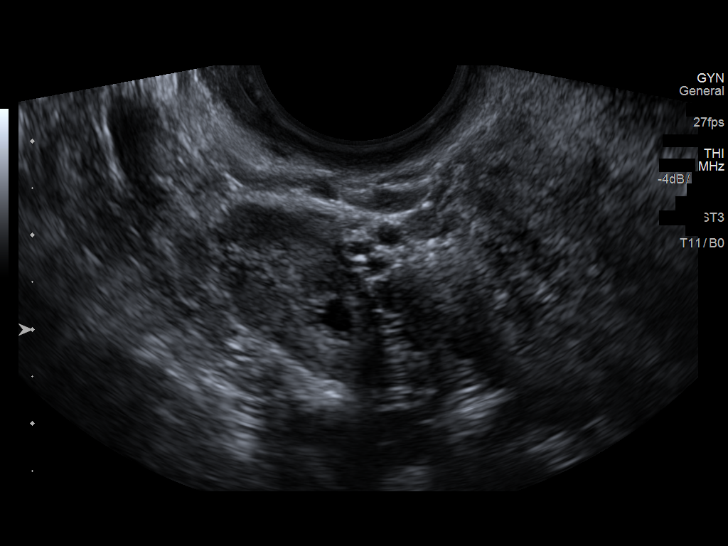
[im 42/67]
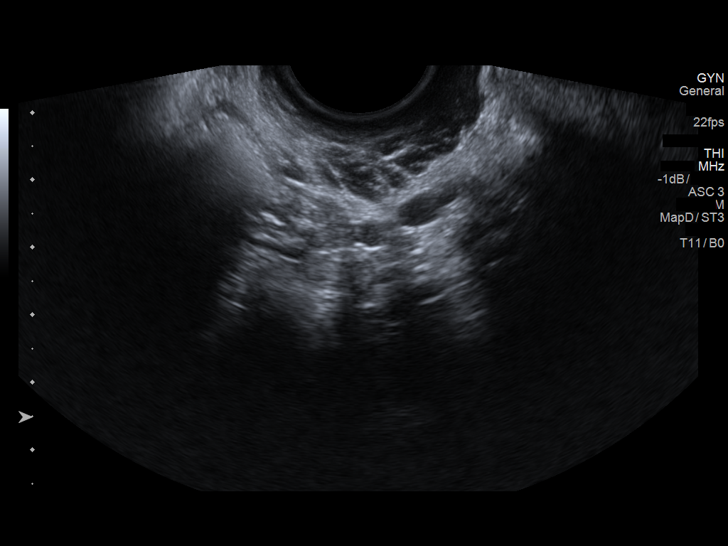
[im 45/67]
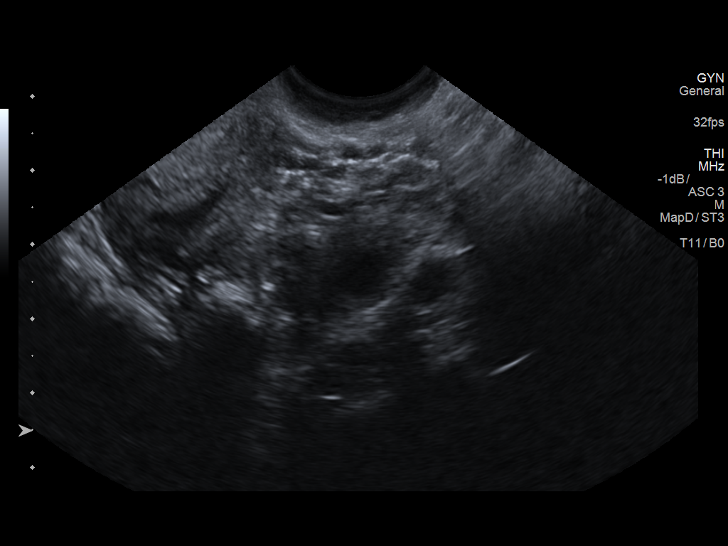
[im 50/67]
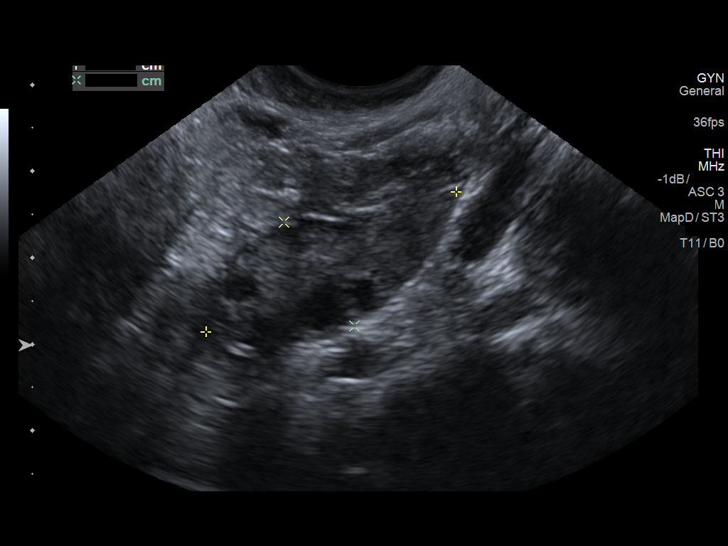
[im 56/67]
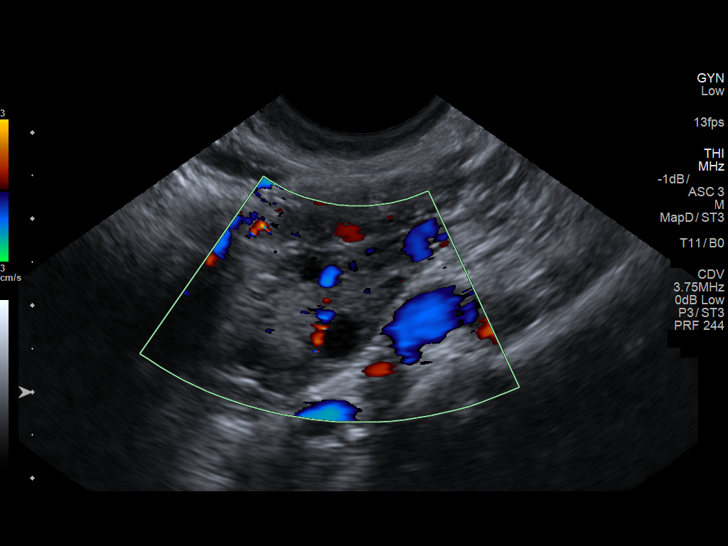
[im 61/67]
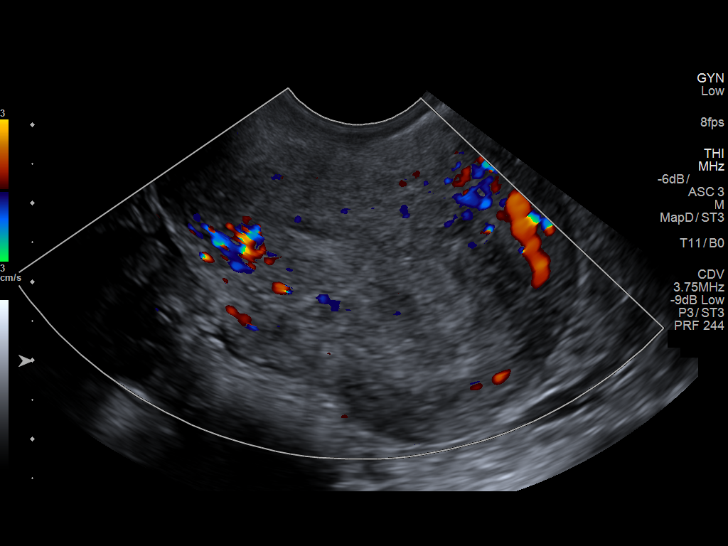
[im 67/67]
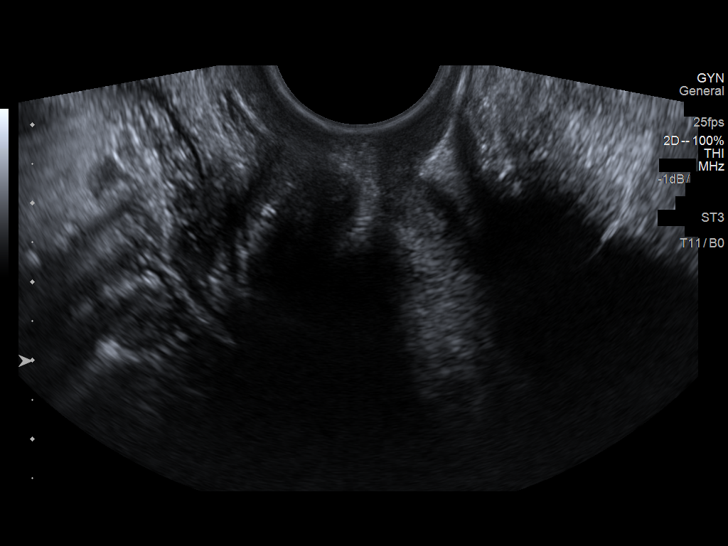

[14 of 25 positions shown; findings below may reference images not displayed]

FINDINGS: Uterus

Measurements: 6.5 x 4.0 x 3.9 cm. No fibroids or other mass
visualized.

Endometrium

Thickness:  0.7 cm.  No focal abnormality visualized.

Right ovary

Measurements: 2.6 x 1.7 x 1.6 cm. Normal appearance/no adnexal mass.

Left ovary

Measurements: 3.3 x 1.5 x 2.2 cm. Normal appearance/no adnexal mass.

Other findings

Trace free fluid is noted within the pelvic cul-de-sac.
IMPRESSION: Unremarkable pelvic ultrasound.  No evidence for ovarian torsion.

## 2017-11-27 ENCOUNTER — Encounter (HOSPITAL_COMMUNITY): Payer: Self-pay

## 2017-11-27 ENCOUNTER — Other Ambulatory Visit: Payer: Self-pay

## 2017-11-27 DIAGNOSIS — R112 Nausea with vomiting, unspecified: Secondary | ICD-10-CM | POA: Insufficient documentation

## 2017-11-27 DIAGNOSIS — R740 Nonspecific elevation of levels of transaminase and lactic acid dehydrogenase [LDH]: Secondary | ICD-10-CM | POA: Insufficient documentation

## 2017-11-27 DIAGNOSIS — Z79899 Other long term (current) drug therapy: Secondary | ICD-10-CM | POA: Insufficient documentation

## 2017-11-27 LAB — COMPREHENSIVE METABOLIC PANEL
ALK PHOS: 51 U/L (ref 38–126)
ALT: 57 U/L — AB (ref 14–54)
AST: 44 U/L — ABNORMAL HIGH (ref 15–41)
Albumin: 4.3 g/dL (ref 3.5–5.0)
Anion gap: 8 (ref 5–15)
BUN: 6 mg/dL (ref 6–20)
CHLORIDE: 104 mmol/L (ref 101–111)
CO2: 24 mmol/L (ref 22–32)
CREATININE: 0.62 mg/dL (ref 0.44–1.00)
Calcium: 9.6 mg/dL (ref 8.9–10.3)
GFR calc non Af Amer: >60 mL/min (ref >60)
Glucose, Bld: 102 mg/dL — ABNORMAL HIGH (ref 65–99)
Potassium: 3.6 mmol/L (ref 3.5–5.1)
SODIUM: 136 mmol/L (ref 135–145)
Total Bilirubin: 1.6 mg/dL — ABNORMAL HIGH (ref 0.3–1.2)
Total Protein: 7.5 g/dL (ref 6.5–8.1)

## 2017-11-27 LAB — CBC
HCT: 39 % (ref 36.0–46.0)
HEMOGLOBIN: 13.7 g/dL (ref 12.0–15.0)
MCH: 32.9 pg (ref 26.0–34.0)
MCHC: 35.1 g/dL (ref 30.0–36.0)
MCV: 93.8 fL (ref 78.0–100.0)
Platelets: 227 10*3/uL (ref 150–400)
RBC: 4.16 MIL/uL (ref 3.87–5.11)
RDW: 11.5 % (ref 11.5–15.5)
WBC: 9.4 10*3/uL (ref 4.0–10.5)

## 2017-11-27 LAB — URINALYSIS, ROUTINE W REFLEX MICROSCOPIC
BILIRUBIN URINE: NEGATIVE
GLUCOSE, UA: NEGATIVE mg/dL
Hgb urine dipstick: NEGATIVE
Ketones, ur: NEGATIVE mg/dL
Leukocytes, UA: NEGATIVE
Nitrite: NEGATIVE
Protein, ur: NEGATIVE mg/dL
Specific Gravity, Urine: 1.006 (ref 1.005–1.030)
pH: 7 (ref 5.0–8.0)

## 2017-11-27 LAB — I-STAT BETA HCG BLOOD, ED (MC, WL, AP ONLY): I-stat hCG, quantitative: <5 m[IU]/mL (ref <5)

## 2017-11-27 LAB — LIPASE, BLOOD: Lipase: 30 U/L (ref 11–51)

## 2017-11-27 NOTE — ED Triage Notes (Signed)
Pt states that since Thursday she has been vomiting and unable to keep anything down, denies diarrhea or abd pain

## 2017-11-28 ENCOUNTER — Emergency Department (HOSPITAL_COMMUNITY)
Admission: EM | Admit: 2017-11-28 | Discharge: 2017-11-28 | Disposition: A | Discharge status: Home / Self Care | Payer: Self-pay | Attending: Emergency Medicine | Admitting: Emergency Medicine

## 2017-11-28 DIAGNOSIS — R740 Nonspecific elevation of levels of transaminase and lactic acid dehydrogenase [LDH]: Secondary | ICD-10-CM

## 2017-11-28 DIAGNOSIS — R7401 Elevation of levels of liver transaminase levels: Secondary | ICD-10-CM

## 2017-11-28 DIAGNOSIS — R112 Nausea with vomiting, unspecified: Secondary | ICD-10-CM

## 2017-11-28 MED ORDER — SODIUM CHLORIDE 0.9 % IV BOLUS (SEPSIS)
1000.0000 mL | Freq: Once | INTRAVENOUS | Status: AC
  Administered 2017-11-28: 1000 mL via INTRAVENOUS

## 2017-11-28 MED ORDER — METOCLOPRAMIDE HCL 10 MG PO TABS: 10.0000 mg | ORAL_TABLET | Freq: Four times a day (QID) | ORAL | 0 refills | Status: AC | PRN

## 2017-11-28 MED ORDER — ONDANSETRON HCL 4 MG/2ML IJ SOLN
4.0000 mg | Freq: Once | INTRAMUSCULAR | Status: AC
  Administered 2017-11-28: 4 mg via INTRAVENOUS
  Filled 2017-11-28: qty 2

## 2017-11-28 NOTE — ED Provider Notes (Signed)
[facility] EMERGENCY DEPARTMENT Provider Note   CSN: [id number] Arrival date & time: 11/27/17  2135     History   Chief Complaint Chief Complaint  Patient presents with  . Emesis    HPI Felicia Hopkins is a 32 y.o. female.  The history is provided by the patient.  She complains of nausea and vomiting for the last 3 days.  During this time, she has not been able to hold anything down.  She denies fever or chills.  She denies abdominal pain.  She denies diarrhea.  There have been no known sick contacts.  She has not tried any treatment at home.  History reviewed. No pertinent past medical history.  There are no active problems to display for this patient.   History reviewed. No pertinent surgical history.  OB History    No data available       Home Medications    Prior to Admission medications   Medication Sig Start Date End Date Taking? Authorizing Provider  cephALEXin (KEFLEX) 500 MG capsule Take 1 capsule (500 mg total) by mouth 4 (four) times daily. 08/18/13   Linwood Dibbles, MD  metroNIDAZOLE (FLAGYL) 500 MG tablet Take 1 tablet (500 mg total) by mouth 2 (two) times daily. 07/31/16   Elson Areas, PA-C  ondansetron (ZOFRAN ODT) 8 MG disintegrating tablet Take 1 tablet (8 mg total) by mouth every 8 (eight) hours as needed for nausea. 08/18/13   Linwood Dibbles, MD    Family History No family history on file.  Social History Social History   Tobacco Use  . Smoking status: Never Smoker  . Smokeless tobacco: Never Used  Substance Use Topics  . Alcohol use: No  . Drug use: No     Allergies   Patient has no known allergies.   Review of Systems Review of Systems  All other systems reviewed and are negative.    Physical Exam Updated Vital Signs BP 115/72 (BP Location: Right Arm)   Pulse 75   Temp 98.4 F (36.9 C) (Oral)   Resp 16   Ht 5\' 6"  (1.676 m)   Wt 69.9 kg (154 lb)   LMP 11/25/2017   SpO2 100%   BMI 24.86 kg/m   Physical  Exam  Nursing note and vitals reviewed.  32 year old female, resting comfortably and in no acute distress. Vital signs are normal. Oxygen saturation is 100%, which is normal. Head is normocephalic and atraumatic. PERRLA, EOMI. Oropharynx is clear. Neck is nontender and supple without adenopathy or JVD. Back is nontender and there is no CVA tenderness. Lungs are clear without rales, wheezes, or rhonchi. Chest is nontender. Heart has regular rate and rhythm without murmur. Abdomen is soft, flat, nontender without masses or hepatosplenomegaly and peristalsis is hypoactive. Extremities have no cyanosis or edema, full range of motion is present. Skin is warm and dry without rash. Neurologic: Mental status is normal, cranial nerves are intact, there are no motor or sensory deficits.  ED Treatments / Results  Labs (all labs ordered are listed, but only abnormal results are displayed) Labs Reviewed  COMPREHENSIVE METABOLIC PANEL - Abnormal; Notable for the following components:      Result Value   Glucose, Bld 102 (*)    AST 44 (*)    ALT 57 (*)    Total Bilirubin 1.6 (*)    All other components within normal limits  URINALYSIS, ROUTINE W REFLEX MICROSCOPIC - Abnormal; Notable for the following components:  APPearance HAZY (*)    All other components within normal limits  LIPASE, BLOOD  CBC  I-STAT BETA HCG BLOOD, ED ([facility], [facility], [facility] ONLY)   Procedures Procedures (including critical care time)  Medications Ordered in ED Medications  sodium chloride 0.9 % bolus 1,000 mL (0 mLs Intravenous Stopped 11/28/17 0255)  ondansetron (ZOFRAN) injection 4 mg (4 mg Intravenous Given 11/28/17 0140)     Initial Impression / Assessment and Plan / ED Course  I have reviewed the triage vital signs and the nursing notes.  Pertinent lab results that were available during my care of the patient were reviewed by me and considered in my medical decision making (see chart for details).  Nausea and vomiting.   No red flags to suggest more serious disease.  Probable viral gastritis.  Laboratory workup is reassuring.  Mild elevation of transaminases is not felt to be clinically significant.  Mild elevation of bilirubin is noted and had been present in September when she presented with abdominal pain.  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  She will be given IV fluids and ondansetron.  She feels much better after above-noted treatment.  She is discharged with prescription for metoclopramide.  Final Clinical Impressions(s) / ED Diagnoses   Final diagnoses:  Non-intractable vomiting with nausea, unspecified vomiting type  Elevated transaminase level    ED Discharge Orders        Ordered    metoCLOPramide (REGLAN) 10 MG tablet  Every 6 hours PRN     11/28/17 0427       Dione BoozeGlick, Veretta Sabourin, MD 11/28/17 [phone]

## 2020-01-12 ENCOUNTER — Ambulatory Visit: Payer: Self-pay | Attending: Internal Medicine

## 2020-01-12 DIAGNOSIS — Z23 Encounter for immunization: Secondary | ICD-10-CM | POA: Insufficient documentation

## 2020-01-12 NOTE — Progress Notes (Signed)
   Covid-19 Vaccination Clinic  Name:  Felicia Hopkins    MRN: [id number] DOB: November 20, 1986  01/12/2020  [name] was observed post Covid-19 immunization for 15 minutes without incidence. She was provided with Vaccine Information Sheet and instruction to access the V-Safe system.   [name] was instructed to call 911 with any severe reactions post vaccine: Marland Kitchen Difficulty breathing  . Swelling of your face and throat  . A fast heartbeat  . A bad rash all over your body  . Dizziness and weakness    Immunizations Administered    Name Date Dose VIS Date Route   Pfizer COVID-19 Vaccine 01/12/2020  4:56 PM 0.3 mL 11/03/2019 Intramuscular   Manufacturer: [employer], [employer]   Lot: [id number]   NDC: [address]-3

## 2020-02-06 ENCOUNTER — Ambulatory Visit: Payer: Self-pay | Attending: Internal Medicine

## 2020-02-06 DIAGNOSIS — Z23 Encounter for immunization: Secondary | ICD-10-CM

## 2020-02-06 NOTE — Progress Notes (Signed)
   Covid-19 Vaccination Clinic  Name:  Felicia Hopkins    MRN: [id number] DOB: [birth year]  02/06/2020  Felicia Hopkins was observed post Covid-19 immunization for 15 minutes without incident. She was provided with Vaccine Information Sheet and instruction to access the V-Safe system.   Felicia Hopkins was instructed to call 911 with any severe reactions post vaccine: Marland Kitchen Difficulty breathing  . Swelling of face and throat  . A fast heartbeat  . A bad rash all over body  . Dizziness and weakness   Immunizations Administered    Name Date Dose VIS Date Route   Pfizer COVID-19 Vaccine 02/06/2020  9:04 AM 0.3 mL 11/03/2019 Intramuscular   Manufacturer: [employer], [employer]   Lot: [id number]   NDC: [address]-6
